# Patient Record
Sex: Male | Born: 1985 | Race: White | Hispanic: No | Marital: Single | Smoking: Current every day smoker
Health system: Southern US, Community
[De-identification: ages and names within clinical notes are randomized; demographics above are authoritative.]

---
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
## 2008-09-22 ENCOUNTER — Emergency Department: Payer: Self-pay | Admitting: Emergency Medicine

## 2008-11-26 ENCOUNTER — Emergency Department: Payer: Self-pay | Admitting: Emergency Medicine

## 2009-03-04 ENCOUNTER — Emergency Department: Payer: Self-pay | Admitting: Emergency Medicine

## 2009-09-16 ENCOUNTER — Emergency Department: Payer: Self-pay | Admitting: Unknown Physician Specialty

## 2013-05-19 ENCOUNTER — Emergency Department: Payer: Self-pay | Admitting: Emergency Medicine

## 2013-05-26 ENCOUNTER — Emergency Department: Payer: Self-pay | Admitting: Emergency Medicine

## 2013-06-02 ENCOUNTER — Emergency Department: Payer: Self-pay | Admitting: Emergency Medicine

## 2013-06-02 LAB — COMPREHENSIVE METABOLIC PANEL
ALBUMIN: 4 g/dL (ref 3.4–5.0)
AST: 27 U/L (ref 15–37)
Alkaline Phosphatase: 108 U/L
Anion Gap: 9 (ref 7–16)
BILIRUBIN TOTAL: 0.3 mg/dL (ref 0.2–1.0)
BUN: 20 mg/dL — AB (ref 7–18)
CHLORIDE: 105 mmol/L (ref 98–107)
CREATININE: 1.04 mg/dL (ref 0.60–1.30)
Calcium, Total: 8.9 mg/dL (ref 8.5–10.1)
Co2: 22 mmol/L (ref 21–32)
EGFR (Non-African Amer.): >60
Glucose: 110 mg/dL — ABNORMAL HIGH (ref 65–99)
Osmolality: 275 (ref 275–301)
Potassium: 3.5 mmol/L (ref 3.5–5.1)
SGPT (ALT): 25 U/L (ref 12–78)
Sodium: 136 mmol/L (ref 136–145)
TOTAL PROTEIN: 7.8 g/dL (ref 6.4–8.2)

## 2013-06-02 LAB — CBC WITH DIFFERENTIAL/PLATELET
BASOS ABS: 0.1 10*3/uL (ref 0.0–0.1)
Basophil %: 0.4 %
EOS ABS: 0.1 10*3/uL (ref 0.0–0.7)
EOS PCT: 0.4 %
HCT: 38 % — ABNORMAL LOW (ref 40.0–52.0)
HGB: 13.1 g/dL (ref 13.0–18.0)
LYMPHS ABS: 6.1 10*3/uL — AB (ref 1.0–3.6)
Lymphocyte %: 33.8 %
MCH: 30.7 pg (ref 26.0–34.0)
MCHC: 34.5 g/dL (ref 32.0–36.0)
MCV: 89 fL (ref 80–100)
MONO ABS: 0.7 x10 3/mm (ref 0.2–1.0)
MONOS PCT: 3.7 %
NEUTROS ABS: 11.1 10*3/uL — AB (ref 1.4–6.5)
Neutrophil %: 61.7 %
Platelet: 326 10*3/uL (ref 150–440)
RBC: 4.26 10*6/uL — ABNORMAL LOW (ref 4.40–5.90)
RDW: 13.9 % (ref 11.5–14.5)
WBC: 17.9 10*3/uL — AB (ref 3.8–10.6)

## 2013-06-02 LAB — DRUG SCREEN, URINE
Amphetamines, Ur Screen: NEGATIVE (ref ?–1000)
BENZODIAZEPINE, UR SCRN: NEGATIVE (ref ?–200)
Barbiturates, Ur Screen: NEGATIVE (ref ?–200)
Cannabinoid 50 Ng, Ur ~~LOC~~: POSITIVE (ref ?–50)
Cocaine Metabolite,Ur ~~LOC~~: NEGATIVE (ref ?–300)
MDMA (Ecstasy)Ur Screen: NEGATIVE (ref ?–500)
Methadone, Ur Screen: NEGATIVE (ref ?–300)
Opiate, Ur Screen: POSITIVE (ref ?–300)
Phencyclidine (PCP) Ur S: NEGATIVE (ref ?–25)
Tricyclic, Ur Screen: NEGATIVE (ref ?–1000)

## 2013-06-02 LAB — URINALYSIS, COMPLETE
BILIRUBIN, UR: NEGATIVE
BLOOD: NEGATIVE
Bacteria: NONE SEEN
Glucose,UR: NEGATIVE mg/dL (ref 0–75)
Ketone: NEGATIVE
Leukocyte Esterase: NEGATIVE
NITRITE: NEGATIVE
PH: 5 (ref 4.5–8.0)
Protein: NEGATIVE
RBC,UR: <1 /HPF (ref 0–5)
Specific Gravity: 1.009 (ref 1.003–1.030)
Squamous Epithelial: NONE SEEN
WBC UR: <1 /HPF (ref 0–5)

## 2013-06-02 LAB — ETHANOL
Ethanol %: 0.201 % — ABNORMAL HIGH (ref 0.000–0.080)
Ethanol: 201 mg/dL
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
## 2017-11-20 ENCOUNTER — Other Ambulatory Visit: Payer: Self-pay

## 2017-11-20 ENCOUNTER — Emergency Department
Admission: EM | Admit: 2017-11-20 | Discharge: 2017-11-20 | Disposition: A | Discharge status: Home / Self Care | Payer: Self-pay | Attending: Emergency Medicine | Admitting: Emergency Medicine

## 2017-11-20 ENCOUNTER — Encounter: Payer: Self-pay | Admitting: Emergency Medicine

## 2017-11-20 DIAGNOSIS — Y939 Activity, unspecified: Secondary | ICD-10-CM | POA: Insufficient documentation

## 2017-11-20 DIAGNOSIS — W260XXA Contact with knife, initial encounter: Secondary | ICD-10-CM | POA: Insufficient documentation

## 2017-11-20 DIAGNOSIS — S61211A Laceration without foreign body of left index finger without damage to nail, initial encounter: Secondary | ICD-10-CM | POA: Insufficient documentation

## 2017-11-20 DIAGNOSIS — F17210 Nicotine dependence, cigarettes, uncomplicated: Secondary | ICD-10-CM | POA: Insufficient documentation

## 2017-11-20 DIAGNOSIS — Y929 Unspecified place or not applicable: Secondary | ICD-10-CM | POA: Insufficient documentation

## 2017-11-20 DIAGNOSIS — Y998 Other external cause status: Secondary | ICD-10-CM | POA: Insufficient documentation

## 2017-11-20 MED ORDER — LIDOCAINE HCL (PF) 1 % IJ SOLN
5.0000 mL | Freq: Once | INTRAMUSCULAR | Status: AC
  Administered 2017-11-20: 5 mL
  Filled 2017-11-20: qty 5

## 2017-11-20 NOTE — Discharge Instructions (Addendum)
Keep the wound clean, dry, and covered. [facility] 10-12 days.

## 2017-11-20 NOTE — ED Provider Notes (Signed)
[facility] Emergency Department Provider Note ____________________________________________  Time seen: 1300  I have reviewed the triage vital signs and the nursing notes.  HISTORY  Chief Complaint  Laceration  HPI Vernon Rodriguez is a 32 y.o. male presents himself to the ED for evaluation of accidental laceration to the left index finger.  Patient describes using a fillet knife when he accidentally cut the palmar aspect of his index finger.  He denies any distal paresthesias or dysfunction of the joint.  He reports a current tetanus status.  No other injuries reported at this time.  History reviewed. No pertinent past medical history.  There are no active problems to display for this patient.  History reviewed. No pertinent surgical history.  Prior to Admission medications   Not on File    Allergies Penicillins; Wool alcohol  [lanolin]; and Acetaminophen  No family history on file.  Social History Social History   Tobacco Use  . Smoking status: Current Every Day Smoker    Packs/day: 1.00    Types: Cigarettes  . Smokeless tobacco: Never Used  Substance Use Topics  . Alcohol use: Not on file  . Drug use: Not on file    Review of Systems  Constitutional: Negative for fever. Cardiovascular: Negative for chest pain. Respiratory: Negative for shortness of breath. Musculoskeletal: Negative for back pain. Skin: Negative for rash.  Left index finger laceration as above. Neurological: Negative for headaches, focal weakness or numbness. ____________________________________________  PHYSICAL EXAM:  VITAL SIGNS: ED Triage Vitals  Enc Vitals Group     BP 11/20/17 1224 (!) 146/104     Pulse Rate 11/20/17 1224 78     Resp 11/20/17 1224 18     Temp 11/20/17 1226 98.4 F (36.9 C)     Temp Source 11/20/17 1226 Oral     SpO2 11/20/17 1224 99 %     Weight 11/20/17 1224 175 lb (79.4 kg)     Height 11/20/17 1224 5\' 11"  (1.803 m)     Head  Circumference --      Peak Flow --      Pain Score 11/20/17 1224 7     Pain Loc --      Pain Edu? --      Excl. in GC? --     Constitutional: Alert and oriented. Well appearing and in no distress. Head: Normocephalic and atraumatic. Cardiovascular: Normal rate, regular rhythm. Normal distal pulses. Respiratory: Normal respiratory effort.  Musculoskeletal: Left hand with normal composite fist.  Patient with a large flap laceration noted to the middle phalanx of the left index finger.  Nontender with normal range of motion in all extremities.  Neurologic:  Normal gross sensation.  Normal intrinsic and opposition testing.  Normal speech and language. No gross focal neurologic deficits are appreciated. Skin:  Skin is warm, dry and intact. No rash noted. ____________________________________________  PROCEDURES  .Marland KitchenLaceration Repair Date/Time: 11/20/2017 4:56 PM Performed by: Lissa Hoard, PA-C Authorized by: Lissa Hoard, PA-C   Consent:    Consent obtained:  Verbal   Consent given by:  Patient   Risks discussed:  Pain Anesthesia (see MAR for exact dosages):    Anesthesia method:  Local infiltration (Transthecal block)   Local anesthetic:  Lidocaine 1% w/o epi Laceration details:    Location:  Finger   Finger location:  L index finger   Length (cm):  4 Repair type:    Repair type:  Simple Pre-procedure details:  Preparation:  Patient was prepped and draped in usual sterile fashion Exploration:    Contaminated: no   Treatment:    Area cleansed with:  Betadine   Amount of cleaning:  Standard Skin repair:    Repair method:  Sutures   Suture size:  5-0   Suture material:  Nylon   Suture technique:  Simple interrupted   Number of sutures:  5 Approximation:    Approximation:  Close Post-procedure details:    Dressing:  Non-adherent dressing   Patient tolerance of procedure:  Tolerated well, no immediate  complications  ___________________________________________  INITIAL IMPRESSION / ASSESSMENT AND PLAN / ED COURSE  Patient with ED evaluation and management of accidental laceration to left index finger.  Patient's wound is repaired using sutures.  Good wound approximation is achieved.  He is discharged with wound care instructions and will follow-up as discussed. ____________________________________________  FINAL CLINICAL IMPRESSION(S) / ED DIAGNOSES  Final diagnoses:  Laceration of left index finger without foreign body without damage to nail, initial encounter      Lissa HoardMenshew, Derris Millan V Bacon, PA-C 11/20/17 1658    Emily FilbertWilliams, Jonathan E, MD 11/28/17 [phone]

## 2017-11-20 NOTE — ED Notes (Signed)
Left finger #2 with lac. Bleeding controlled

## 2017-11-20 NOTE — ED Triage Notes (Addendum)
Pt c/o left index finger laceration, using filet knife to cut zip ties. Pt has hand wrapped in towel on arrival.  Pt able to move finger, bleeding controlled.  Last tetanus 2 years ago.
